# Patient Record
Sex: Male | Born: 1985 | Hispanic: Yes | Smoking: Former smoker
Health system: Southern US, Community
[De-identification: ages and names within clinical notes are randomized; demographics above are authoritative.]

## PROBLEM LIST (undated history)

## (undated) DIAGNOSIS — F4310 Post-traumatic stress disorder, unspecified: Secondary | ICD-10-CM

## (undated) DIAGNOSIS — I10 Essential (primary) hypertension: Secondary | ICD-10-CM

## (undated) DIAGNOSIS — G4730 Sleep apnea, unspecified: Secondary | ICD-10-CM

## (undated) HISTORY — DX: Essential (primary) hypertension: I10

## (undated) HISTORY — DX: Sleep apnea, unspecified: G47.30

---

## 2018-05-02 ENCOUNTER — Emergency Department (HOSPITAL_COMMUNITY)
Admission: EM | Admit: 2018-05-02 | Discharge: 2018-05-02 | Disposition: A | Discharge status: Home / Self Care | Payer: Non-veteran care | Attending: Emergency Medicine | Admitting: Emergency Medicine

## 2018-05-02 ENCOUNTER — Encounter (HOSPITAL_COMMUNITY): Payer: Self-pay | Admitting: *Deleted

## 2018-05-02 ENCOUNTER — Other Ambulatory Visit: Payer: Self-pay

## 2018-05-02 DIAGNOSIS — F410 Panic disorder [episodic paroxysmal anxiety] without agoraphobia: Secondary | ICD-10-CM | POA: Diagnosis not present

## 2018-05-02 DIAGNOSIS — R55 Syncope and collapse: Secondary | ICD-10-CM | POA: Diagnosis present

## 2018-05-02 HISTORY — DX: Post-traumatic stress disorder, unspecified: F43.10

## 2018-05-02 LAB — CBG MONITORING, ED: GLUCOSE-CAPILLARY: 92 mg/dL (ref 70–99)

## 2018-05-02 NOTE — ED Provider Notes (Signed)
[facility]-EMERGENCY DEPT Provider Note  CSN: [id number] Arrival date & time: 05/02/18 0054  Chief Complaint(s) Near Syncope  HPI Kenneth Horton is a 32 y.o. male with a history of PTSD who presents to the emergency department with near syncope.  Patient reports that he is been suffering from left shoulder pain and has been taken Flexeril which was previously prescribed to him last year.  He reports that the near syncopal episode occurred just prior to arrival.  States that he was trying to fall asleep when his mind started racing.  He began having a panic attack and hyperventilating.  He reports getting up to go to the bathroom when he started feeling lightheaded with upper and lower extremity paresthesias.  States that his legs became weak and he lowered himself to the ground.  Denied any falls or trauma.  No loss of consciousness.  He denied any associated chest pain or shortness of breath.  No nausea or vomiting.  No abdominal pain.  He denies any recent fevers or infections.  He did endorse smoking marijuana just prior to the episode.  Denied any alcohol consumption.  Denied any other illicit drug use.  HPI  Past Medical History Past Medical History:  Diagnosis Date  . PTSD (post-traumatic stress disorder)    There are no active problems to display for this patient.  Home Medication(s) Prior to Admission medications   Medication Sig Start Date End Date Taking? Authorizing Provider  cyclobenzaprine (FLEXERIL) 10 MG tablet Take 10 mg by mouth 3 (three) times daily as needed for muscle spasms.   Yes [provider]                                                                                                                                    Past Surgical History History reviewed. No pertinent surgical history. Family History No family history on file.  Social History Social History   Tobacco Use  . Smoking status: Not on file  Substance  Use Topics  . Alcohol use: Not on file  . Drug use: Not on file   Allergies Patient has no known allergies.  Review of Systems Review of Systems All other systems are reviewed and are negative for acute change except as noted in the HPI  Physical Exam Vital Signs  I have reviewed the triage vital signs BP (!) 145/82 (BP Location: Right Arm)   Pulse 79   Temp 98.3 F (36.8 C) (Oral)   Resp 16   Ht 5\' 9"  (1.753 m)   Wt 99.8 kg   SpO2 99%   BMI 32.49 kg/m   Physical Exam  Constitutional: He is oriented to person, place, and time. He appears well-developed and well-nourished. No distress.  HENT:  Head: Normocephalic and atraumatic.  Nose: Nose normal.  Eyes: Pupils are equal, round, and reactive to light. Conjunctivae and EOM are normal. Right eye exhibits no  discharge. Left eye exhibits no discharge. No scleral icterus.  Neck: Normal range of motion. Neck supple.  Cardiovascular: Normal rate and regular rhythm. Exam reveals no gallop and no friction rub.  No murmur heard. Pulmonary/Chest: Effort normal and breath sounds normal. No stridor. No respiratory distress. He has no rales.  Abdominal: Soft. He exhibits no distension. There is no tenderness.  Musculoskeletal: He exhibits no edema or tenderness.  Neurological: He is alert and oriented to person, place, and time.  Skin: Skin is warm and dry. No rash noted. He is not diaphoretic. No erythema.  Psychiatric: He has a normal mood and affect.  Vitals reviewed.   ED Results and Treatments Labs (all labs ordered are listed, but only abnormal results are displayed) Labs Reviewed  CBG MONITORING, ED  CBG MONITORING, ED                                                                                                                         EKG  EKG Interpretation  Date/Time:  Tuesday May 02 2018 01:06:36 EDT Ventricular Rate:  77 PR Interval:    QRS Duration: 114 QT Interval:  375 QTC Calculation: 425 R  Axis:   79 Text Interpretation:  Sinus rhythm Incomplete right bundle branch block ST elev, probable normal early repol pattern Baseline wander in lead(s) II III aVR aVF NO STEMI No old tracing to compare Confirmed by Drema Pry [phone]) on 05/02/2018 1:50:26 AM      Radiology No results found. Pertinent labs & imaging results that were available during my care of the patient were reviewed by me and considered in my medical decision making (see chart for details).  Medications Ordered in ED Medications - No data to display                                                                                                                                  Procedures Procedures  (including critical care time)  Medical Decision Making / ED Course I have reviewed the nursing notes for this encounter and the patient's prior records (if available in EHR or on provided paperwork).    Near syncopal episode in the setting of a panic attack with hyperventilation as well as medication with 10 mg of Flexeril and marijuana use.  Patient denied any symptoms concerning for cardiac etiology.  EKG with normal sinus rhythm and incomplete right bundle branch block,  otherwise no evidence of HOCM, Brugada, epsilon waves, no ischemia or dysrhythmias.  CBG wnl.  Orthostatics reassuring.  Patient able to tolerate oral hydration.  Ambulated without complication.  The patient appears reasonably screened and/or stabilized for discharge and I doubt any other medical condition or other [facility] requiring further screening, evaluation, or treatment in the ED at this time prior to discharge.  The patient is safe for discharge with strict return precautions.   Final Clinical Impression(s) / ED Diagnoses Final diagnoses:  Near syncope  Panic attack    Disposition: Discharge  Condition: Good  I have discussed the results, Dx and Tx plan with the patient who expressed understanding and agree(s) with the plan.  Discharge instructions discussed at great length. The patient was given strict return precautions who verbalized understanding of the instructions. No further questions at time of discharge.    ED Discharge Orders    None       [facility]  Schedule an appointment as soon as possible for a visit  As needed     This chart was dictated using voice recognition software.  Despite best efforts to proofread,  errors can occur which can change the documentation meaning.   Nira Conn, MD 05/02/18 [phone]

## 2018-05-02 NOTE — ED Triage Notes (Signed)
Pt states around midnight he had gotten up to go to the bathroom and he felt like he was having a panic attack, said his legs gave out on him and he passed out.

## 2019-05-12 ENCOUNTER — Emergency Department (HOSPITAL_COMMUNITY)
Admission: EM | Admit: 2019-05-12 | Discharge: 2019-05-12 | Disposition: A | Discharge status: Home / Self Care | Payer: No Typology Code available for payment source | Attending: Emergency Medicine | Admitting: Emergency Medicine

## 2019-05-12 ENCOUNTER — Other Ambulatory Visit: Payer: Self-pay

## 2019-05-12 ENCOUNTER — Encounter (HOSPITAL_COMMUNITY): Payer: Self-pay

## 2019-05-12 DIAGNOSIS — Y93E6 Activity, residential relocation: Secondary | ICD-10-CM | POA: Diagnosis not present

## 2019-05-12 DIAGNOSIS — W228XXA Striking against or struck by other objects, initial encounter: Secondary | ICD-10-CM | POA: Diagnosis not present

## 2019-05-12 DIAGNOSIS — Y999 Unspecified external cause status: Secondary | ICD-10-CM | POA: Diagnosis not present

## 2019-05-12 DIAGNOSIS — Z87891 Personal history of nicotine dependence: Secondary | ICD-10-CM | POA: Diagnosis not present

## 2019-05-12 DIAGNOSIS — S51812A Laceration without foreign body of left forearm, initial encounter: Secondary | ICD-10-CM | POA: Insufficient documentation

## 2019-05-12 DIAGNOSIS — Y92009 Unspecified place in unspecified non-institutional (private) residence as the place of occurrence of the external cause: Secondary | ICD-10-CM | POA: Diagnosis not present

## 2019-05-12 MED ORDER — LIDOCAINE HCL (PF) 1 % IJ SOLN
5.0000 mL | Freq: Once | INTRAMUSCULAR | Status: DC
  Filled 2019-05-12: qty 30

## 2019-05-12 NOTE — Discharge Instructions (Addendum)
WOUND CARE °Please have your stitches/staples removed in 7-10 days or sooner if you have concerns. You may do this at any available urgent care or at your primary care doctor's office. ° Keep area clean and dry for 24 hours. Do not remove °bandage, if applied. ° After 24 hours, remove bandage and wash wound °gently with mild soap and warm water. Reapply °a new bandage after cleaning wound, if directed. ° Continue daily cleansing with soap and water until °stitches/staples are removed. ° Do not apply any ointments or creams to the wound °while stitches/staples are in place, as this may cause °delayed healing. ° Seek medical careif you experience any of the following °signs of infection: Swelling, redness, pus drainage, °streaking, fever >101.0 F ° Seek care if you experience excessive bleeding °that does not stop after 15-20 minutes of constant, firm °pressure. °  °

## 2019-05-12 NOTE — ED Provider Notes (Signed)
[facility]   CSN: [id number] Arrival date & time: 05/12/19  1914     History   Chief Complaint No chief complaint on file.   HPI Kenneth Horton is a 33 y.o. male with no significant past medical history presents emergency department chief complaint of left forearm laceration.  Patient states that he was helping his mother move furniture when his left arm was caught by a metal piece on the furniture.  Bleeding is controlled at this time.  He is up-to-date on his tetanus vaccination which was in 2014.  He denies any numbness, tingling, weakness in the hand or wrist.  He has no other complaints. Laceration occurred about 2 hours ago.    HPI  Past Medical History:  Diagnosis Date  . PTSD (post-traumatic stress disorder)     There are no active problems to display for this patient.   No past surgical history on file.      Home Medications    Prior to Admission medications   Medication Sig Start Date End Date Taking? Authorizing Provider  cyclobenzaprine (FLEXERIL) 10 MG tablet Take 10 mg by mouth 3 (three) times daily as needed for muscle spasms.    [provider]    Family History No family history on file.  Social History Social History   Tobacco Use  . Smoking status: Former Games developer  . Smokeless tobacco: Former Engineer, water Use Topics  . Alcohol Use    Frequency: Never    Comment: socially  . Drug use: Never     Allergies   Patient has no known allergies.   Review of Systems Review of Systems  Skin: Positive for wound.  Neurological: Negative for weakness and numbness.     Physical Exam Updated Vital Signs BP (!) 146/84 (BP Location: Right Arm)   Pulse 99   Temp 98.5 F (36.9 C) (Oral)   Resp 18   Ht 5\' 9"  (1.753 m)   Wt 93 kg   SpO2 98%   BMI 30.27 kg/m   Physical Exam Vitals signs and nursing note reviewed.  Constitutional:      General: He is not in acute distress.     Appearance: He is well-developed. He is not diaphoretic.  HENT:     Head: Normocephalic and atraumatic.  Eyes:     General: No scleral icterus.    Conjunctiva/sclera: Conjunctivae normal.  Neck:     Musculoskeletal: Normal range of motion and neck supple.  Cardiovascular:     Rate and Rhythm: Normal rate and regular rhythm.     Heart sounds: Normal heart sounds. No friction rub.  Pulmonary:     Effort: Pulmonary effort is normal. No respiratory distress.     Breath sounds: Normal breath sounds.  Abdominal:     Palpations: Abdomen is soft.     Tenderness: There is no abdominal tenderness.  Musculoskeletal:     Comments: LUE exam Inspection- No erythema, swelling, atrophy, hypertrophy, abrasions, or lacerations noted. Palpation- No TTP, compartments soft ROM- Full ROM about the shoulder, elbow, wrist. Strength- 5/5 AIN/PIN/U NV- SILT M/R/U, +2 Radial +2 ulnar pulse Superficial laceration left forearm  Skin:    General: Skin is warm and dry.  Neurological:     Mental Status: He is alert.  Psychiatric:        Behavior: Behavior normal.      ED Treatments / Results  Labs (all labs ordered are listed, but only abnormal results  are displayed) Labs Reviewed - No data to display  EKG None  Radiology No results found.  Procedures .Marland KitchenLaceration Repair  Date/Time: 05/12/2019 10:28 PM Performed by: Margarita Mail, PA-C Authorized by: Margarita Mail, PA-C   Consent:    Consent obtained:  Verbal   Consent given by:  Patient   Risks discussed:  Infection, need for additional repair, pain, poor cosmetic result and poor wound healing   Alternatives discussed:  No treatment and delayed treatment Universal protocol:    Procedure explained and questions answered to patient or proxy's satisfaction: yes     Relevant documents present and verified: yes     Test results available and properly labeled: yes     Imaging studies available: yes     Required blood products,  implants, devices, and special equipment available: yes     Site/side marked: yes     Immediately prior to procedure, a time out was called: yes     Patient identity confirmed:  Verbally with patient Anesthesia (see MAR for exact dosages):    Anesthesia method:  Local infiltration   Local anesthetic:  Lidocaine 1% w/o epi Laceration details:    Location:  Shoulder/arm   Shoulder/arm location:  L upper arm   Length (cm):  2 Repair type:    Repair type:  Simple Pre-procedure details:    Preparation:  Patient was prepped and draped in usual sterile fashion Exploration:    Wound exploration: wound explored through full range of motion   Treatment:    Area cleansed with:  Betadine   Amount of cleaning:  Standard   Irrigation solution:  Sterile water Skin repair:    Repair method:  Sutures   Suture size:  5-0   Suture material:  Prolene   Suture technique:  Running locked   Number of sutures:  3 Approximation:    Approximation:  Close Post-procedure details:    Dressing:  Non-adherent dressing   Patient tolerance of procedure:  Tolerated well, no immediate complications   (including critical care time)  Medications Ordered in ED Medications  lidocaine (PF) (XYLOCAINE) 1 % injection 5 mL (has no administration in time range)     Initial Impression / Assessment and Plan / ED Course  I have reviewed the triage vital signs and the nursing notes.  Pertinent labs & imaging results that were available during my care of the patient were reviewed by me and considered in my medical decision making (see chart for details).    Kenneth Horton is a 33 y.o. male who presents to ED for laceration of the left forearm. Wound thoroughly cleaned in ED today. Wound explored and bottom of wound seen in a bloodless field. Laceration repaired as dictated above. Patient counseled on home wound care. Follow up with PCP/urgent care or return to ER for suture removal in 7-10 days. Patient was  urged to return to the Emergency Department for worsening pain, swelling, expanding erythema especially if it streaks away from the affected area, fever, or for any additional concerns. Patient verbalized understanding. All questions answered.       Final Clinical Impressions(s) / ED Diagnoses   Final diagnoses:  None    ED Discharge Orders    None       Margarita Mail, PA-C 05/12/19 2229    Julianne Rice, MD 05/12/19 2317

## 2019-05-12 NOTE — ED Triage Notes (Signed)
Pt was helping mom move furniture and metal piece cut left forearm 1 inch lac. Pt reports no numbness or pain at the site.

## 2020-05-30 ENCOUNTER — Emergency Department (HOSPITAL_COMMUNITY)
Admission: EM | Admit: 2020-05-30 | Discharge: 2020-05-31 | Disposition: A | Discharge status: Home / Self Care | Payer: No Typology Code available for payment source | Attending: Emergency Medicine | Admitting: Emergency Medicine

## 2020-05-30 ENCOUNTER — Other Ambulatory Visit: Payer: Self-pay

## 2020-05-30 ENCOUNTER — Encounter (HOSPITAL_COMMUNITY): Payer: Self-pay | Admitting: Emergency Medicine

## 2020-05-30 DIAGNOSIS — S0992XA Unspecified injury of nose, initial encounter: Secondary | ICD-10-CM | POA: Diagnosis present

## 2020-05-30 DIAGNOSIS — Z87891 Personal history of nicotine dependence: Secondary | ICD-10-CM | POA: Diagnosis not present

## 2020-05-30 DIAGNOSIS — Y92481 Parking lot as the place of occurrence of the external cause: Secondary | ICD-10-CM | POA: Insufficient documentation

## 2020-05-30 DIAGNOSIS — R55 Syncope and collapse: Secondary | ICD-10-CM | POA: Insufficient documentation

## 2020-05-30 DIAGNOSIS — W01198A Fall on same level from slipping, tripping and stumbling with subsequent striking against other object, initial encounter: Secondary | ICD-10-CM | POA: Insufficient documentation

## 2020-05-30 DIAGNOSIS — S022XXA Fracture of nasal bones, initial encounter for closed fracture: Secondary | ICD-10-CM | POA: Diagnosis not present

## 2020-05-30 DIAGNOSIS — Y9301 Activity, walking, marching and hiking: Secondary | ICD-10-CM | POA: Diagnosis not present

## 2020-05-30 LAB — CBC WITH DIFFERENTIAL/PLATELET
Abs Immature Granulocytes: 0.06 10*3/uL (ref 0.00–0.07)
Basophils Absolute: 0 10*3/uL (ref 0.0–0.1)
Basophils Relative: 1 %
Eosinophils Absolute: 0.1 10*3/uL (ref 0.0–0.5)
Eosinophils Relative: 1 %
HCT: 43.8 % (ref 39.0–52.0)
Hemoglobin: 15 g/dL (ref 13.0–17.0)
Immature Granulocytes: 1 %
Lymphocytes Relative: 20 %
Lymphs Abs: 1.7 10*3/uL (ref 0.7–4.0)
MCH: 29.9 pg (ref 26.0–34.0)
MCHC: 34.2 g/dL (ref 30.0–36.0)
MCV: 87.3 fL (ref 80.0–100.0)
Monocytes Absolute: 0.7 10*3/uL (ref 0.1–1.0)
Monocytes Relative: 9 %
Neutro Abs: 5.8 10*3/uL (ref 1.7–7.7)
Neutrophils Relative %: 68 %
Platelets: 200 10*3/uL (ref 150–400)
RBC: 5.02 MIL/uL (ref 4.22–5.81)
RDW: 13.2 % (ref 11.5–15.5)
WBC: 8.5 10*3/uL (ref 4.0–10.5)
nRBC: 0 % (ref 0.0–0.2)

## 2020-05-30 LAB — COMPREHENSIVE METABOLIC PANEL
ALT: 80 U/L — ABNORMAL HIGH (ref 0–44)
AST: 35 U/L (ref 15–41)
Albumin: 4.1 g/dL (ref 3.5–5.0)
Alkaline Phosphatase: 66 U/L (ref 38–126)
Anion gap: 9 (ref 5–15)
BUN: 17 mg/dL (ref 6–20)
CO2: 26 mmol/L (ref 22–32)
Calcium: 9.2 mg/dL (ref 8.9–10.3)
Chloride: 100 mmol/L (ref 98–111)
Creatinine, Ser: 0.99 mg/dL (ref 0.61–1.24)
GFR, Estimated: >60 mL/min (ref >60)
Glucose, Bld: 129 mg/dL — ABNORMAL HIGH (ref 70–99)
Potassium: 3.8 mmol/L (ref 3.5–5.1)
Sodium: 135 mmol/L (ref 135–145)
Total Bilirubin: 0.3 mg/dL (ref 0.3–1.2)
Total Protein: 6.9 g/dL (ref 6.5–8.1)

## 2020-05-30 LAB — URINALYSIS, ROUTINE W REFLEX MICROSCOPIC
Bilirubin Urine: NEGATIVE
Glucose, UA: NEGATIVE mg/dL
Hgb urine dipstick: NEGATIVE
Ketones, ur: NEGATIVE mg/dL
Leukocytes,Ua: NEGATIVE
Nitrite: NEGATIVE
Protein, ur: NEGATIVE mg/dL
Specific Gravity, Urine: 1.008 (ref 1.005–1.030)
pH: 5 (ref 5.0–8.0)

## 2020-05-30 NOTE — ED Triage Notes (Signed)
Pt st's he had a syncopal episode earlier tonight.  St's when he fell he hit his face on cement.  Pt c/o nose pain and headache.  No hx of passing out in past

## 2020-05-31 ENCOUNTER — Other Ambulatory Visit: Payer: Self-pay

## 2020-05-31 ENCOUNTER — Emergency Department (HOSPITAL_COMMUNITY): Payer: No Typology Code available for payment source

## 2020-05-31 MED ORDER — OXYCODONE-ACETAMINOPHEN 5-325 MG PO TABS
1.0000 | ORAL_TABLET | Freq: Once | ORAL | Status: AC
  Administered 2020-05-31: 1 via ORAL
  Filled 2020-05-31: qty 1

## 2020-05-31 NOTE — ED Provider Notes (Signed)
[facility] EMERGENCY DEPARTMENT Provider Note   CSN: [id number] Arrival date & time: 05/30/20  2141     History Chief Complaint  Patient presents with  . Syncopal Episode    Kenneth Horton is a 34 y.o. male.  Patient presents to the emergency department for evaluation after syncopal episode.  Patient was eating dinner when he started to feel bad.  He reports any suddenly felt very dizzy and like he was going to pass out.  He left the restaurant with his girlfriend to go outside and get some fresh air.  She reports that as they are walking across the parking lot he fell to his knees and then hit his face on the ground.  Patient complaining of nose pain at this time.  He has never had any previous syncopal episodes.  He is not experiencing any chest pain, shortness of breath.        Past Medical History:  Diagnosis Date  . PTSD (post-traumatic stress disorder)   . PTSD (post-traumatic stress disorder)     There are no problems to display for this patient.   History reviewed. No pertinent surgical history.     No family history on file.  Social History   Tobacco Use  . Smoking status: Former Games developer  . Smokeless tobacco: Former Clinical biochemist  . Vaping Use: Never used  Substance Use Topics  . Alcohol use: Never    Comment: socially  . Drug use: Never    Home Medications Prior to Admission medications   Not on File    Allergies    Patient has no known allergies.  Review of Systems   Review of Systems  HENT: Positive for facial swelling.   Neurological: Positive for syncope.  All other systems reviewed and are negative.   Physical Exam Updated Vital Signs BP 107/71 (BP Location: Right Arm)   Pulse 70   Temp 98 F (36.7 C) (Oral)   Resp 17   Ht 5\' 9"  (1.753 m)   Wt 117.9 kg   SpO2 96%   BMI 38.40 kg/m   Physical Exam Vitals and nursing note reviewed.  Constitutional:      General: He is not in acute distress.     Appearance: Normal appearance. He is well-developed.  HENT:     Head: Normocephalic. Abrasion (Nose) and contusion (Nose) present.     Jaw: There is normal jaw occlusion.     Right Ear: Hearing normal.     Left Ear: Hearing normal.     Nose: Nose normal.  Eyes:     Conjunctiva/sclera: Conjunctivae normal.     Pupils: Pupils are equal, round, and reactive to light.  Cardiovascular:     Rate and Rhythm: Regular rhythm.     Heart sounds: S1 normal and S2 normal. No murmur heard.  No friction rub. No gallop.   Pulmonary:     Effort: Pulmonary effort is normal. No respiratory distress.     Breath sounds: Normal breath sounds.  Chest:     Chest wall: No tenderness.  Abdominal:     General: Bowel sounds are normal.     Palpations: Abdomen is soft.     Tenderness: There is no abdominal tenderness. There is no guarding or rebound. Negative signs include Murphy's sign and McBurney's sign.     Hernia: No hernia is present.  Musculoskeletal:        General: Normal range of motion.     Cervical  back: Normal range of motion and neck supple.  Skin:    General: Skin is warm and dry.     Findings: No rash.  Neurological:     Mental Status: He is alert and oriented to person, place, and time.     GCS: GCS eye subscore is 4. GCS verbal subscore is 5. GCS motor subscore is 6.     Cranial Nerves: No cranial nerve deficit.     Sensory: No sensory deficit.     Coordination: Coordination normal.  Psychiatric:        Speech: Speech normal.        Behavior: Behavior normal.        Thought Content: Thought content normal.     ED Results / Procedures / Treatments   Labs (all labs ordered are listed, but only abnormal results are displayed) Labs Reviewed  COMPREHENSIVE METABOLIC PANEL - Abnormal; Notable for the following components:      Result Value   Glucose, Bld 129 (*)    ALT 80 (*)    All other components within normal limits  URINALYSIS, ROUTINE W REFLEX MICROSCOPIC - Abnormal; Notable  for the following components:   Color, Urine STRAW (*)    All other components within normal limits  CBC WITH DIFFERENTIAL/PLATELET    EKG EKG Interpretation  Date/Time:  Friday May 30 2020 22:07:28 EDT Ventricular Rate:  114 PR Interval:  144 QRS Duration: 104 QT Interval:  338 QTC Calculation: 465 R Axis:   95 Text Interpretation: Sinus tachycardia Possible Left atrial enlargement Rightward axis Incomplete right bundle branch block Borderline ECG No significant change since last tracing Confirmed by Gilda Crease [phone]) on 05/31/2020 5:58:30 AM   Radiology CT HEAD WO CONTRAST  Result Date: 05/31/2020 CLINICAL DATA:  Head trauma, moderate/severe. Syncopal episode, fall in face down, abrasion to nose. EXAM: CT HEAD WITHOUT CONTRAST CT MAXILLOFACIAL WITHOUT CONTRAST TECHNIQUE: Multidetector CT imaging of the head and maxillofacial structures were performed using the standard protocol without intravenous contrast. Multiplanar CT image reconstructions of the maxillofacial structures were also generated. COMPARISON:  No pertinent prior exams are available for comparison. FINDINGS: CT HEAD FINDINGS Brain: Cerebral volume is normal. There is no acute intracranial hemorrhage. No demarcated cortical infarct. No extra-axial fluid collection. No evidence of intracranial mass. No midline shift. Vascular: No hyperdense vessel. Skull: Normal. Negative for fracture or focal lesion. CT MAXILLOFACIAL FINDINGS Osseous: Minimally displaced fracture of the left nasal bone (series 3, image 67) (series 5, image 5). No other maxillofacial fracture is identified. Orbits: No acute finding. The globes are normal in size and contour. The extraocular muscles and optic nerve sheath complexes are symmetric and unremarkable. Sinuses: Mild ethmoid and maxillary sinus mucosal thickening. Soft tissues: Soft tissue swelling along the nose. IMPRESSION: CT head: No evidence of acute intracranial abnormality. CT  maxillofacial: 1. Minimally displaced fracture of the left nasal bone. 2. Soft tissue swelling along the nose. 3. Mild ethmoid and maxillary sinus mucosal thickening. Electronically Signed   By: Jackey Loge DO   On: 05/31/2020 07:09   CT MAXILLOFACIAL WO CONTRAST  Result Date: 05/31/2020 CLINICAL DATA:  Head trauma, moderate/severe. Syncopal episode, fall in face down, abrasion to nose. EXAM: CT HEAD WITHOUT CONTRAST CT MAXILLOFACIAL WITHOUT CONTRAST TECHNIQUE: Multidetector CT imaging of the head and maxillofacial structures were performed using the standard protocol without intravenous contrast. Multiplanar CT image reconstructions of the maxillofacial structures were also generated. COMPARISON:  No pertinent prior exams are available for comparison.  FINDINGS: CT HEAD FINDINGS Brain: Cerebral volume is normal. There is no acute intracranial hemorrhage. No demarcated cortical infarct. No extra-axial fluid collection. No evidence of intracranial mass. No midline shift. Vascular: No hyperdense vessel. Skull: Normal. Negative for fracture or focal lesion. CT MAXILLOFACIAL FINDINGS Osseous: Minimally displaced fracture of the left nasal bone (series 3, image 67) (series 5, image 5). No other maxillofacial fracture is identified. Orbits: No acute finding. The globes are normal in size and contour. The extraocular muscles and optic nerve sheath complexes are symmetric and unremarkable. Sinuses: Mild ethmoid and maxillary sinus mucosal thickening. Soft tissues: Soft tissue swelling along the nose. IMPRESSION: CT head: No evidence of acute intracranial abnormality. CT maxillofacial: 1. Minimally displaced fracture of the left nasal bone. 2. Soft tissue swelling along the nose. 3. Mild ethmoid and maxillary sinus mucosal thickening. Electronically Signed   By: Jackey Loge DO   On: 05/31/2020 07:09    Procedures Procedures (including critical care time)  Medications Ordered in ED Medications   oxyCODONE-acetaminophen (PERCOCET/ROXICET) 5-325 MG per tablet 1 tablet (1 tablet Oral Given 05/31/20 0537)    ED Course  I have reviewed the triage vital signs and the nursing notes.  Pertinent labs & imaging results that were available during my care of the patient were reviewed by me and considered in my medical decision making (see chart for details).    MDM Rules/Calculators/A&P                          Patient presents to the emergency department for evaluation after a syncopal episode.  Patient did have precursor some things of feeling unwell and dizziness.  He is neurologically at his baseline.  No history of any neurologic deficits, none currently.  No cardiac symptoms.  Patient's main complaint is nose pain.  CT head and maxillofacial bones does show minimally displaced left nasal bone fracture, no other injury.  Lab work was otherwise unremarkable.  Cause of syncope unclear at this time but there is low concern for cardiac syncope.  Final Clinical Impression(s) / ED Diagnoses Final diagnoses:  Closed fracture of nasal bone, initial encounter  Syncope, unspecified syncope type    Rx / DC Orders ED Discharge Orders    None       Gilda Crease, MD 05/31/20 [phone]

## 2020-05-31 NOTE — ED Notes (Signed)
Patient with abrasions to nose; no bleeding; says fell on his face when he passed out earlier.

## 2020-05-31 NOTE — ED Notes (Signed)
Assuming care of patient at this time. Pt is resting in bed and in NAD. Side rails up for safety. Bed in low position. Call bell within reach.

## 2020-08-12 ENCOUNTER — Other Ambulatory Visit: Payer: Self-pay

## 2020-08-12 ENCOUNTER — Emergency Department (HOSPITAL_COMMUNITY)
Admission: EM | Admit: 2020-08-12 | Discharge: 2020-08-13 | Disposition: A | Discharge status: Home / Self Care | Payer: No Typology Code available for payment source | Attending: Emergency Medicine | Admitting: Emergency Medicine

## 2020-08-12 ENCOUNTER — Encounter (HOSPITAL_COMMUNITY): Payer: Self-pay | Admitting: *Deleted

## 2020-08-12 DIAGNOSIS — R002 Palpitations: Secondary | ICD-10-CM | POA: Diagnosis not present

## 2020-08-12 DIAGNOSIS — F419 Anxiety disorder, unspecified: Secondary | ICD-10-CM | POA: Insufficient documentation

## 2020-08-12 DIAGNOSIS — R0789 Other chest pain: Secondary | ICD-10-CM | POA: Insufficient documentation

## 2020-08-12 DIAGNOSIS — Z87891 Personal history of nicotine dependence: Secondary | ICD-10-CM | POA: Insufficient documentation

## 2020-08-12 DIAGNOSIS — R000 Tachycardia, unspecified: Secondary | ICD-10-CM | POA: Insufficient documentation

## 2020-08-12 DIAGNOSIS — R200 Anesthesia of skin: Secondary | ICD-10-CM | POA: Diagnosis not present

## 2020-08-12 DIAGNOSIS — R079 Chest pain, unspecified: Secondary | ICD-10-CM

## 2020-08-12 LAB — BASIC METABOLIC PANEL
Anion gap: 13 (ref 5–15)
BUN: 14 mg/dL (ref 6–20)
CO2: 22 mmol/L (ref 22–32)
Calcium: 9.6 mg/dL (ref 8.9–10.3)
Chloride: 99 mmol/L (ref 98–111)
Creatinine, Ser: 0.92 mg/dL (ref 0.61–1.24)
GFR, Estimated: >60 mL/min (ref >60)
Glucose, Bld: 113 mg/dL — ABNORMAL HIGH (ref 70–99)
Potassium: 4.1 mmol/L (ref 3.5–5.1)
Sodium: 134 mmol/L — ABNORMAL LOW (ref 135–145)

## 2020-08-12 LAB — CBC
HCT: 47.3 % (ref 39.0–52.0)
Hemoglobin: 16.9 g/dL (ref 13.0–17.0)
MCH: 31.1 pg (ref 26.0–34.0)
MCHC: 35.7 g/dL (ref 30.0–36.0)
MCV: 87.1 fL (ref 80.0–100.0)
Platelets: 217 10*3/uL (ref 150–400)
RBC: 5.43 MIL/uL (ref 4.22–5.81)
RDW: 12.4 % (ref 11.5–15.5)
WBC: 9.8 10*3/uL (ref 4.0–10.5)
nRBC: 0 % (ref 0.0–0.2)

## 2020-08-12 NOTE — ED Triage Notes (Signed)
Pt says that he was riding home, he felt jittery, weak, short of breath and anxious on Thursday- has had similar episodes on and off. Having palpitations intermittently. Has been having issues with sleeping at night.

## 2020-08-13 LAB — TSH: TSH: 3.419 u[IU]/mL (ref 0.350–4.500)

## 2020-08-13 LAB — TROPONIN I (HIGH SENSITIVITY): Troponin I (High Sensitivity): 5 ng/L (ref <18)

## 2020-08-13 MED ORDER — HYDROXYZINE HCL 25 MG PO TABS: 25.0000 mg | ORAL_TABLET | Freq: Four times a day (QID) | ORAL | 0 refills | Status: DC | PRN

## 2020-08-13 NOTE — Discharge Instructions (Addendum)
Your heart test and thyroid test are normal. [facility]-up with [facility] and local cardiologist as needed. You can take hydroxyzine for severe anxiety if other nonpharmaceutical options do not work.

## 2020-08-13 NOTE — ED Provider Notes (Signed)
[facility] EMERGENCY DEPARTMENT Provider Note   CSN: [id number] Arrival date & time: 08/12/20  2025     History Chief Complaint  Patient presents with  . Palpitations    Kenneth Horton is a 35 y.o. male.  Patient with history of PTSD, anxiety, has been off medications for 1 year since he has been doing well presents with intermittent chest pressure, feeling jittery, intermittent numbness hands and feet for the past week.  Patient's had these in the past however gradually worsening.  Patient's had weight gain recently since having a child being up all night eating more approximately 20 pounds.  No history of heart problems.  No recent travel, leg swelling, recent surgery or blood clot history.  Currently minimal chest pressure, no shortness of breath.  No exertional symptoms.  No fevers or cough.        Past Medical History:  Diagnosis Date  . PTSD (post-traumatic stress disorder)   . PTSD (post-traumatic stress disorder)     There are no problems to display for this patient.   History reviewed. No pertinent surgical history.     No family history on file.  Social History   Tobacco Use  . Smoking status: Former Games developer  . Smokeless tobacco: Former Clinical biochemist  . Vaping Use: Never used  Substance Use Topics  . Alcohol use: Never    Comment: socially  . Drug use: Never    Home Medications Prior to Admission medications   Medication Sig Start Date End Date Taking? Authorizing Provider  hydrOXYzine (ATARAX/VISTARIL) 25 MG tablet Take 1 tablet (25 mg total) by mouth every 6 (six) hours as needed for anxiety. 08/13/20  Yes Blane Ohara, MD    Allergies    Patient has no known allergies.  Review of Systems   Review of Systems  Constitutional: Negative for chills and fever.  HENT: Negative for congestion.   Eyes: Negative for visual disturbance.  Respiratory: Negative for cough and shortness of breath.   Cardiovascular: Positive  for chest pain.  Gastrointestinal: Negative for abdominal pain and vomiting.  Genitourinary: Negative for dysuria and flank pain.  Musculoskeletal: Negative for back pain, neck pain and neck stiffness.  Skin: Negative for rash.  Neurological: Positive for light-headedness. Negative for headaches.  Psychiatric/Behavioral: The patient is nervous/anxious.     Physical Exam Updated Vital Signs BP (!) 150/90   Pulse 85   Temp 98 F (36.7 C) (Oral)   Resp 20   SpO2 99%   Physical Exam Vitals and nursing note reviewed.  Constitutional:      Appearance: He is well-developed and well-nourished.  HENT:     Head: Normocephalic and atraumatic.  Eyes:     General:        Right eye: No discharge.        Left eye: No discharge.     Conjunctiva/sclera: Conjunctivae normal.  Neck:     Trachea: No tracheal deviation.  Cardiovascular:     Rate and Rhythm: Normal rate and regular rhythm.  Pulmonary:     Effort: Pulmonary effort is normal.     Breath sounds: Normal breath sounds.  Abdominal:     General: There is no distension.     Palpations: Abdomen is soft.     Tenderness: There is no abdominal tenderness. There is no guarding.  Musculoskeletal:        General: No swelling, tenderness or edema. Normal range of motion.     Cervical  back: Normal range of motion and neck supple.  Skin:    General: Skin is warm.     Findings: No rash.  Neurological:     General: No focal deficit present.     Mental Status: He is alert and oriented to person, place, and time.  Psychiatric:        Mood and Affect: Mood is anxious.     ED Results / Procedures / Treatments   Labs (all labs ordered are listed, but only abnormal results are displayed) Labs Reviewed  BASIC METABOLIC PANEL - Abnormal; Notable for the following components:      Result Value   Sodium 134 (*)    Glucose, Bld 113 (*)    All other components within normal limits  CBC  TSH  TROPONIN I (HIGH SENSITIVITY)    EKG EKG  Interpretation  Date/Time:  Tuesday August 12 2020 21:46:35 EST Ventricular Rate:  103 PR Interval:  154 QRS Duration: 104 QT Interval:  340 QTC Calculation: 445 R Axis:   111 Text Interpretation: Sinus tachycardia Poor baseline Reconfirmed by Blane Ohara [phone]) on 08/13/2020 8:36:42 AM   Radiology No results found.  Procedures Procedures (including critical care time)  Medications Ordered in ED Medications - No data to display  ED Course  I have reviewed the triage vital signs and the nursing notes.  Pertinent labs & imaging results that were available during my care of the patient were reviewed by me and considered in my medical decision making (see chart for details).    MDM Rules/Calculators/A&P                          Patient presents with clinical concern for anxiety as cause for symptoms, other differentials include atypical ACS, thyroid related metabolic, arrhythmia, other.  Initial blood work reviewed overall unremarkable sodium 134, other electrolytes normal, normal kidney function, normal hemoglobin and normal white blood cell count.  With intermittent chest pressure plan for single troponin and thyroid testing with outpatient follow-up at the [facility] and local cardiologist as needed.  Troponin and thyroid testing within normal limits.  Patient stable for continued outpatient follow-up.  Hydroxyzine as needed for a few days.   Final Clinical Impression(s) / ED Diagnoses Final diagnoses:  Anxiety  Palpitations  Acute chest pain    Rx / DC Orders ED Discharge Orders         Ordered    hydrOXYzine (ATARAX/VISTARIL) 25 MG tablet  Every 6 hours PRN        08/13/20 1034           Blane Ohara, MD 08/13/20 1034

## 2020-09-03 ENCOUNTER — Encounter (HOSPITAL_COMMUNITY): Payer: Self-pay

## 2020-09-03 ENCOUNTER — Emergency Department (HOSPITAL_COMMUNITY)
Admission: EM | Admit: 2020-09-03 | Discharge: 2020-09-03 | Disposition: A | Discharge status: Home / Self Care | Payer: No Typology Code available for payment source | Attending: Emergency Medicine | Admitting: Emergency Medicine

## 2020-09-03 DIAGNOSIS — R55 Syncope and collapse: Secondary | ICD-10-CM | POA: Diagnosis not present

## 2020-09-03 DIAGNOSIS — Z79899 Other long term (current) drug therapy: Secondary | ICD-10-CM | POA: Diagnosis not present

## 2020-09-03 DIAGNOSIS — R42 Dizziness and giddiness: Secondary | ICD-10-CM | POA: Diagnosis not present

## 2020-09-03 DIAGNOSIS — R002 Palpitations: Secondary | ICD-10-CM | POA: Diagnosis not present

## 2020-09-03 DIAGNOSIS — Z87891 Personal history of nicotine dependence: Secondary | ICD-10-CM | POA: Diagnosis not present

## 2020-09-03 LAB — CBC
HCT: 47.7 % (ref 39.0–52.0)
Hemoglobin: 16.1 g/dL (ref 13.0–17.0)
MCH: 29.6 pg (ref 26.0–34.0)
MCHC: 33.8 g/dL (ref 30.0–36.0)
MCV: 87.7 fL (ref 80.0–100.0)
Platelets: 176 10*3/uL (ref 150–400)
RBC: 5.44 MIL/uL (ref 4.22–5.81)
RDW: 12.2 % (ref 11.5–15.5)
WBC: 7.2 10*3/uL (ref 4.0–10.5)
nRBC: 0 % (ref 0.0–0.2)

## 2020-09-03 LAB — URINALYSIS, ROUTINE W REFLEX MICROSCOPIC
Bilirubin Urine: NEGATIVE
Glucose, UA: NEGATIVE mg/dL
Hgb urine dipstick: NEGATIVE
Ketones, ur: NEGATIVE mg/dL
Nitrite: NEGATIVE
Protein, ur: NEGATIVE mg/dL
Specific Gravity, Urine: 1.012 (ref 1.005–1.030)
pH: 5 (ref 5.0–8.0)

## 2020-09-03 LAB — BASIC METABOLIC PANEL
Anion gap: 13 (ref 5–15)
BUN: 11 mg/dL (ref 6–20)
CO2: 24 mmol/L (ref 22–32)
Calcium: 9.4 mg/dL (ref 8.9–10.3)
Chloride: 102 mmol/L (ref 98–111)
Creatinine, Ser: 1.01 mg/dL (ref 0.61–1.24)
GFR, Estimated: >60 mL/min (ref >60)
Glucose, Bld: 114 mg/dL — ABNORMAL HIGH (ref 70–99)
Potassium: 3.8 mmol/L (ref 3.5–5.1)
Sodium: 139 mmol/L (ref 135–145)

## 2020-09-03 NOTE — ED Provider Notes (Signed)
[facility] EMERGENCY DEPARTMENT Provider Note   CSN: [id number] Arrival date & time: 09/03/20  0450     History Chief Complaint  Patient presents with  . Loss of Consciousness    Kenneth Horton is a 35 y.o. male.  Patient presents after syncopal episode and palpitations.  Patient's had recurrent episodes over the past few months.  Worse at night feel lightheaded and heart racing and then sometimes pass out.  Nonexertional.  No cardiac or blood clot history.  Currently no symptoms.  [facility] and [facility] blood test done that were unremarkable except liver function was elevated.  Patient denies alcohol or illegal drugs.  No thyroid disorder history.  Patient feels he is stable from mental health standpoint with PTSD history.        Past Medical History:  Diagnosis Date  . PTSD (post-traumatic stress disorder)   . PTSD (post-traumatic stress disorder)     There are no problems to display for this patient.   History reviewed. No pertinent surgical history.     No family history on file.  Social History   Tobacco Use  . Smoking status: Former Games developer  . Smokeless tobacco: Former Clinical biochemist  . Vaping Use: Never used  Substance Use Topics  . Alcohol use: Never    Comment: socially  . Drug use: Never    Home Medications Prior to Admission medications   Medication Sig Start Date End Date Taking? Authorizing Provider  hydrOXYzine (ATARAX/VISTARIL) 25 MG tablet Take 1 tablet (25 mg total) by mouth every 6 (six) hours as needed for anxiety. 08/13/20   Blane Ohara, MD    Allergies    Patient has no known allergies.  Review of Systems   Review of Systems  Constitutional: Negative for chills and fever.  HENT: Negative for congestion.   Eyes: Negative for visual disturbance.  Respiratory: Negative for shortness of breath.   Cardiovascular: Negative for chest pain.  Gastrointestinal: Negative for abdominal pain  and vomiting.  Genitourinary: Negative for dysuria and flank pain.  Musculoskeletal: Negative for back pain, neck pain and neck stiffness.  Skin: Negative for rash.  Neurological: Positive for syncope and light-headedness. Negative for headaches.    Physical Exam Updated Vital Signs BP 140/83 (BP Location: Left Arm)   Pulse 74   Temp 98.3 F (36.8 C) (Oral)   Resp 16   SpO2 99%   Physical Exam Vitals and nursing note reviewed.  Constitutional:      Appearance: He is well-developed and well-nourished.  HENT:     Head: Normocephalic and atraumatic.  Eyes:     General:        Right eye: No discharge.        Left eye: No discharge.     Conjunctiva/sclera: Conjunctivae normal.  Neck:     Trachea: No tracheal deviation.  Cardiovascular:     Rate and Rhythm: Normal rate and regular rhythm.     Heart sounds: No murmur heard.   Pulmonary:     Effort: Pulmonary effort is normal.     Breath sounds: Normal breath sounds.  Abdominal:     General: There is no distension.     Palpations: Abdomen is soft.     Tenderness: There is no abdominal tenderness. There is no guarding.  Musculoskeletal:        General: No edema.     Cervical back: Normal range of motion and neck supple.  Skin:  General: Skin is warm.     Findings: No rash.  Neurological:     Mental Status: He is alert and oriented to person, place, and time.  Psychiatric:        Mood and Affect: Mood and affect normal.     ED Results / Procedures / Treatments   Labs (all labs ordered are listed, but only abnormal results are displayed) Labs Reviewed  BASIC METABOLIC PANEL - Abnormal; Notable for the following components:      Result Value   Glucose, Bld 114 (*)    All other components within normal limits  URINALYSIS, ROUTINE W REFLEX MICROSCOPIC - Abnormal; Notable for the following components:   APPearance HAZY (*)    Leukocytes,Ua TRACE (*)    Bacteria, UA RARE (*)    All other components within normal  limits  CBC  RAPID URINE DRUG SCREEN, HOSP PERFORMED  CBG MONITORING, ED    EKG EKG Interpretation  Date/Time:  Wednesday September 03 2020 05:01:44 EST Ventricular Rate:  74 PR Interval:  136 QRS Duration: 110 QT Interval:  378 QTC Calculation: 419 R Axis:   95 Text Interpretation: Normal sinus rhythm Rightward axis Incomplete right bundle branch block Borderline ECG When compared with ECG of 08/12/2020, No significant change was found Confirmed by Dione Booze ([id number]) on 09/03/2020 5:08:50 AM Also confirmed by Blane Ohara [phone])  on 09/03/2020 9:34:43 AM   Radiology No results found.  Procedures Procedures   Medications Ordered in ED Medications - No data to display  ED Course  I have reviewed the triage vital signs and the nursing notes.  Pertinent labs & imaging results that were available during my care of the patient were reviewed by me and considered in my medical decision making (see chart for details).    MDM Rules/Calculators/A&P                          Patient comes in with recurrent syncope nonexertional.  Patient does have lightheadedness and senses palpitations before the events.  Patient had blood work showing normal hemoglobin, normal electrolytes, normal kidney function.  No chest pain or shortness of breath.  No concern for ACS or pulmonary embolism at this time.  EKG reviewed no acute abnormalities.  With second visit for similar and recurrence recommended follow-up with cardiology discussed Holter monitor/echo and other work-up they deem necessary.  Sent message to cardiology discussed this with the patient.   Final Clinical Impression(s) / ED Diagnoses Final diagnoses:  Near syncope  Palpitations    Rx / DC Orders ED Discharge Orders    None       Blane Ohara, MD 09/03/20 1045

## 2020-09-03 NOTE — ED Triage Notes (Signed)
Pt comes from home via [facility] for syncopal episode, and palpations. Has had similar episodes over the past few months.

## 2020-09-03 NOTE — Discharge Instructions (Signed)
Stay well-hydrated.  Hold your blood pressure medication if you feel lightheaded. Follow-up with cardiology to discuss monitor and possible ultrasound of your heart.

## 2020-09-09 ENCOUNTER — Encounter: Payer: Self-pay | Admitting: Gastroenterology

## 2020-09-09 ENCOUNTER — Ambulatory Visit: Payer: No Typology Code available for payment source | Admitting: Internal Medicine

## 2020-09-09 NOTE — Progress Notes (Deleted)
Cardiology Office Note:    Date:  09/09/2020   ID:  Kenneth Horton, DOB 1986-01-16, MRN [id number]  PCP:  Clinic, [facility]  Cardiologist:  No primary care provider on file. *** Advanced Practice Provider:  No care team member to display Electrophysiologist:  None  {Press F2 to show EP APP, CHF, sleep or structural heart MD               :[id number]}  { Click here to update then REFRESH NOTE - MD (PCP) or APP (Team Member)  Change PCP Type for MD, Specialty for APP is either Cardiology or Clinical Cardiac Electrophysiology  :[id number]}   Referring MD: Clinic, [facility]   No chief complaint on file. ***  History of Present Illness:    Kenneth Horton is a 35 y.o. male with a hx of PTSD who presents for evaluation 09/09/20.  Patient notes that he is feeling ***.    Had 09/03/20 evaluation for syncope and palpitations that were recurrent over several months.  Work up largely benign- recommend [facility] and cardiology follow up.  Has had no chest pain, chest pressure, chest tightness, chest stinging ***.  Discomfort occurs with ***, worsens with ***, and improves with ***.  Patient exertion notable for *** with *** and feels no symptoms.  No shortness of breath, DOE ***.  No PND or orthopnea***.  No bendopnea***, weight gain***, leg swelling ***, or abdominal swelling***.  No syncope or near syncope ***. Notes *** no palpitations or funny heart beats.     Patient reports prior cardiac testing including *** echo, *** stress test, *** heart catheterizations, *** cardioversion, *** ablations.  No history of ***pre-eclampsia, early menarche, or prematurity.  No Fen-Phen or drug use***.  Ambulatory BP ***.   Past Medical History:  Diagnosis Date  . PTSD (post-traumatic stress disorder)   . PTSD (post-traumatic stress disorder)     No past surgical history on file.  Current Medications: No outpatient medications have been marked  as taking for the 09/09/20 encounter (Appointment) with Christell Constant, MD.     Allergies:   Patient has no known allergies.   Social History   Socioeconomic History  . Marital status: Legally Separated    Spouse name: Not on file  . Number of children: Not on file  . Years of education: Not on file  . Highest education level: Not on file  Occupational History  . Not on file  Tobacco Use  . Smoking status: Former Games developer  . Smokeless tobacco: Former Clinical biochemist  . Vaping Use: Never used  Substance and Sexual Activity  . Alcohol use: Never    Comment: socially  . Drug use: Never  . Sexual activity: Not on file  Other Topics Concern  . Not on file  Social History Narrative  . Not on file   Social Determinants of Health   Financial Resource Strain: Not on file  Food Insecurity: Not on file  Transportation Needs: Not on file  Physical Activity: Not on file  Stress: Not on file  Social Connections: Not on file     Family History: History of coronary artery disease notable for ***. History of heart failure notable for ***. No history of cardiomyopathies including hypertrophic cardiomyopathy, left ventricular non-compaction, or arrhythmogenic right ventricular cardiomyopathy.*** History of arrhythmia notable for ***. Denies family history of sudden cardiac death including drowning, car accidents, or unexplained deaths in the family.***  No history of bicuspid aortic valve or aortic aneurysm or dissection.  ROS:   Please see the history of present illness.    *** All other systems reviewed and are negative.  EKGs/Labs/Other Studies Reviewed:    The following studies were reviewed today:  EKG:   09/03/20:  SR 74 iRBBB  Recent Labs: 05/30/2020: ALT 80 08/13/2020: TSH 3.419 09/03/2020: BUN 11; Creatinine, Ser 1.01; Hemoglobin 16.1; Platelets 176; Potassium 3.8; Sodium 139  Recent Lipid Panel No results found for: CHOL, TRIG, HDL, CHOLHDL, VLDL, LDLCALC,  LDLDIRECT   Risk Assessment/Calculations:     N/A  Physical Exam:    VS:  There were no vitals taken for this visit.    Wt Readings from Last 3 Encounters:  05/30/20 260 lb (117.9 kg)  05/12/19 205 lb (93 kg)  05/02/18 220 lb (99.8 kg)     GEN: *** Well nourished, well developed in no acute distress HEENT: Normal NECK: No JVD; No carotid bruits LYMPHATICS: No lymphadenopathy CARDIAC: ***RRR, no murmurs, rubs, gallops RESPIRATORY:  Clear to auscultation without rales, wheezing or rhonchi  ABDOMEN: Soft, non-tender, non-distended MUSCULOSKELETAL:  No edema; No deformity  SKIN: Warm and dry NEUROLOGIC:  Alert and oriented x 3 PSYCHIATRIC:  Normal affect   ASSESSMENT:    No diagnosis found. PLAN:    In order of problems listed above:  1. ***   {Are you ordering a CV Procedure (e.g. stress test, cath, DCCV, TEE, etc)?   Press F2        :[id number]}    Medication Adjustments/Labs and Tests Ordered: Current medicines are reviewed at length with the patient today.  Concerns regarding medicines are outlined above.  No orders of the defined types were placed in this encounter.  No orders of the defined types were placed in this encounter.   There are no Patient Instructions on file for this visit.   Signed, Christell Constant, MD  09/09/2020 2:34 PM    [facility]

## 2020-09-12 ENCOUNTER — Other Ambulatory Visit (INDEPENDENT_AMBULATORY_CARE_PROVIDER_SITE_OTHER): Payer: No Typology Code available for payment source

## 2020-09-12 ENCOUNTER — Encounter: Payer: Self-pay | Admitting: Gastroenterology

## 2020-09-12 ENCOUNTER — Ambulatory Visit (INDEPENDENT_AMBULATORY_CARE_PROVIDER_SITE_OTHER): Payer: No Typology Code available for payment source | Admitting: Gastroenterology

## 2020-09-12 VITALS — BP 138/80 | HR 86 | Ht 69.0 in | Wt 240.0 lb

## 2020-09-12 DIAGNOSIS — R7989 Other specified abnormal findings of blood chemistry: Secondary | ICD-10-CM | POA: Diagnosis not present

## 2020-09-12 DIAGNOSIS — F4310 Post-traumatic stress disorder, unspecified: Secondary | ICD-10-CM | POA: Diagnosis not present

## 2020-09-12 LAB — CBC
HCT: 47.5 % (ref 39.0–52.0)
Hemoglobin: 16.3 g/dL (ref 13.0–17.0)
MCHC: 34.5 g/dL (ref 30.0–36.0)
MCV: 88 fl (ref 78.0–100.0)
Platelets: 153 10*3/uL (ref 150.0–400.0)
RBC: 5.39 Mil/uL (ref 4.22–5.81)
RDW: 13.3 % (ref 11.5–15.5)
WBC: 5.9 10*3/uL (ref 4.0–10.5)

## 2020-09-12 LAB — COMPREHENSIVE METABOLIC PANEL
ALT: 120 U/L — ABNORMAL HIGH (ref 0–53)
AST: 44 U/L — ABNORMAL HIGH (ref 0–37)
Albumin: 4.4 g/dL (ref 3.5–5.2)
Alkaline Phosphatase: 85 U/L (ref 39–117)
BUN: 10 mg/dL (ref 6–23)
CO2: 27 mEq/L (ref 19–32)
Calcium: 9.7 mg/dL (ref 8.4–10.5)
Chloride: 102 mEq/L (ref 96–112)
Creatinine, Ser: 0.9 mg/dL (ref 0.40–1.50)
GFR: 112.21 mL/min (ref >60.00)
Glucose, Bld: 99 mg/dL (ref 70–99)
Potassium: 3.9 mEq/L (ref 3.5–5.1)
Sodium: 136 mEq/L (ref 135–145)
Total Bilirubin: 0.5 mg/dL (ref 0.2–1.2)
Total Protein: 7.4 g/dL (ref 6.0–8.3)

## 2020-09-12 LAB — FERRITIN: Ferritin: 274.8 ng/mL (ref 22.0–322.0)

## 2020-09-12 LAB — PROTIME-INR
INR: 1.1 ratio — ABNORMAL HIGH (ref 0.8–1.0)
Prothrombin Time: 12.3 s (ref 9.6–13.1)

## 2020-09-12 NOTE — Progress Notes (Signed)
HPI: This is a very pleasant 35 year old man whom I am meeting for the first time. He was referred by the [facility] for elevated liver tests  He was in the [employer] for 8 years, he has been out for 4 years. 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 He has PTSD from some of his experiences while in the [employer]. He was tried on a variety of different medicines and has settled on Zoloft and he says it seems to help a bit but not completely. He sees a therapist about once yearly through the [place] system.  He had a son 2 years ago and shortly after that gained 60 pounds in a year. His son caught Covid and was quite ill he was very stressed out about that. He was having some lightheadedness palpitations near syncopal episode. Made some significant changes in his diet eating out less and has lost 15 pounds in the past 3 months or so. He is going to the gym and exercising more.  He has never had hepatitis that he is aware of and has never been jaundiced. Liver disease does not run in his family. He has no significant abdominal pains.   Old Data Reviewed: I reviewed a 25 page packet from the [facility] [place].  I cannot tell exactly who referred him here.  But he was sent for elevated liver tests.  Included in the packet are labs from January 2022.  ALT was 165, AST was 46.  The rest of his complete metabolic profile and CBC were completely normal.   Review of systems: Pertinent positive and negative review of systems were noted in the above HPI section. All other review negative.   Past Medical History:  Diagnosis Date  . PTSD (post-traumatic stress disorder)   . PTSD (post-traumatic stress disorder)     History reviewed. No pertinent surgical history.  Current Outpatient Medications  Medication Sig Dispense Refill  . atenolol (TENORMIN) 25 MG tablet Take 1 tablet by mouth daily.    . sertraline (ZOLOFT) 100 MG tablet TAKE  ONE-HALF TABLET BY MOUTH IN THE MORNING FOR MENTAL HEALTH ANXIETY/DEPRESSION/PTSD INC  DOSE     No current facility-administered medications for this visit.    Allergies as of 09/12/2020  . (No Known Allergies)    History reviewed. No pertinent family history.  Social History   Socioeconomic History  . Marital status: Legally Separated    Spouse name: Not on file  . Number of children: Not on file  . Years of education: Not on file  . Highest education level: Not on file  Occupational History  . Not on file  Tobacco Use  . Smoking status: Former Games developer  . Smokeless tobacco: Former Clinical biochemist  . Vaping Use: Never used  Substance and Sexual Activity  . Alcohol use: Never    Comment: socially  . Drug use: Never  . Sexual activity: Not on file  Other Topics Concern  . Not on file  Social History Narrative  . Not on file   Social Determinants of Health   Financial Resource Strain: Not on file  Food Insecurity: Not on file  Transportation Needs: Not on file  Physical Activity: Not on file  Stress: Not on file  Social Connections: Not on file  Intimate Partner Violence: Not on file     Physical Exam: BP 138/80 (BP Location: Left Arm, Patient Position: Sitting)  Pulse 86   Ht 5\' 9"  (1.753 m)   Wt 240 lb (108.9 kg)   SpO2 94%   BMI 35.44 kg/m  Constitutional: generally well-appearing Psychiatric: alert and oriented x3 Eyes: extraocular movements intact Mouth: oral pharynx moist, no lesions Neck: supple no lymphadenopathy Cardiovascular: heart regular rate and rhythm Lungs: clear to auscultation bilaterally Abdomen: soft, nontender, nondistended, no obvious ascites, no peritoneal signs, normal bowel sounds Extremities: no lower extremity edema bilaterally Skin: no lesions on visible extremities   Assessment and plan: 35 y.o. male with elevated liver tests  I think it is very likely that his elevated liver tests reflect fatty liver disease from his  60 pound weight gain over the past couple years. Fortunately he is already made some significant diet changes and exercise changes in his losing weight about 15 pounds in the past 3 months. I recommend that he continue that trend as best that he could. We will arrange blood tests, see those in the AVS below as well as ultrasound of his liver to exclude other potential causes of his elevated liver tests. I will communicate the results and further recommendations with him directly.  We also spoke about his PTSD struggles. He is unable to see therapist more than about once a year through the [facility] system for a variety of logistic reasons and systemic reasons. I offered he accepted referral to the [place] [facility] to help with his PTSD.   Please see the "Patient Instructions" section for addition details about the plan.   [place], MD [facility] 09/12/2020, 10:59 AM  Cc: Clinic, 09/14/2020  Total time on date of encounter was 45 minutes (this included time spent preparing to see the patient reviewing records; obtaining and/or reviewing separately obtained history; performing a medically appropriate exam and/or evaluation; counseling and educating the patient and family if present; ordering medications, tests or procedures if applicable; and documenting clinical information in the health record).

## 2020-09-12 NOTE — Patient Instructions (Signed)
If you are age 34 or younger, your body mass index should be between 19-25. Your Body mass index is 35.44 kg/m. If this is out of the aformentioned range listed, please consider follow up with your Primary Care Provider.   Your provider has requested that you go to the basement level for lab work before leaving today. Press "B" on the elevator. The lab is located at the first door on the left as you exit the elevator.  You have been scheduled for an abdominal ultrasound at [facility] (1st floor of hospital) on ________ at ________. Please arrive 15 minutes prior to your appointment for registration. Make certain not to have anything to eat or drink 6 hours prior to your appointment. Should you need to reschedule your appointment, please contact radiology at [phone]. This test typically takes about 30 minutes to perform.  We have referred you to [facility].  Someone from their office should contact your regarding an appointment.  Please let our office know if you have not heard anything in 1 to 2 weeks.   Due to recent changes in healthcare laws, you may see the results of your imaging and laboratory studies on MyChart before your provider has had a chance to review them.  We understand that in some cases there may be results that are confusing or concerning to you. Not all laboratory results come back in the same time frame and the provider may be waiting for multiple results in order to interpret others.  Please give [place] 48 hours in order for your provider to thoroughly review all the results before contacting the office for clarification of your results.   Thank you for entrusting me with your care and choosing [facility].  Dr Christella Hartigan

## 2020-09-17 LAB — CERULOPLASMIN: Ceruloplasmin: 26 mg/dL (ref 18–36)

## 2020-09-17 LAB — MITOCHONDRIAL ANTIBODIES: Mitochondrial M2 Ab, IgG: <=20 U

## 2020-09-17 LAB — HEPATITIS B SURFACE ANTIBODY,QUALITATIVE: Hep B S Ab: REACTIVE — AB

## 2020-09-17 LAB — HEPATITIS C ANTIBODY
Hepatitis C Ab: NONREACTIVE
SIGNAL TO CUT-OFF: 0.02 (ref <1.00)

## 2020-09-17 LAB — ANTI-NUCLEAR AB-TITER (ANA TITER): ANA Titer 1: 1:80 {titer} — ABNORMAL HIGH

## 2020-09-17 LAB — ALPHA-1-ANTITRYPSIN: A-1 Antitrypsin, Ser: 124 mg/dL (ref 83–199)

## 2020-09-17 LAB — IGA: Immunoglobulin A: 289 mg/dL (ref 47–310)

## 2020-09-17 LAB — HEPATITIS A ANTIBODY, TOTAL: Hepatitis A AB,Total: REACTIVE — AB

## 2020-09-17 LAB — TISSUE TRANSGLUTAMINASE, IGA: (tTG) Ab, IgA: <1 U/mL

## 2020-09-17 LAB — ANA: Anti Nuclear Antibody (ANA): POSITIVE — AB

## 2020-09-17 LAB — HEPATITIS B SURFACE ANTIGEN: Hepatitis B Surface Ag: NONREACTIVE

## 2020-09-17 LAB — ANTI-SMOOTH MUSCLE ANTIBODY, IGG: Actin (Smooth Muscle) Antibody (IGG): <20 U (ref <20)

## 2020-09-19 ENCOUNTER — Other Ambulatory Visit: Payer: Self-pay

## 2020-09-19 ENCOUNTER — Other Ambulatory Visit: Payer: No Typology Code available for payment source

## 2020-09-19 DIAGNOSIS — R7989 Other specified abnormal findings of blood chemistry: Secondary | ICD-10-CM

## 2020-09-22 ENCOUNTER — Telehealth: Payer: Self-pay

## 2020-09-22 LAB — PROTEIN ELECTROPHORESIS, SERUM
Albumin ELP: 4.8 g/dL (ref 3.8–4.8)
Alpha 1: 0.3 g/dL (ref 0.2–0.3)
Alpha 2: 0.6 g/dL (ref 0.5–0.9)
Beta 2: 0.4 g/dL (ref 0.2–0.5)
Beta Globulin: 0.5 g/dL (ref 0.4–0.6)
Gamma Globulin: 1.1 g/dL (ref 0.8–1.7)
Total Protein: 7.6 g/dL (ref 6.1–8.1)

## 2020-09-22 NOTE — Telephone Encounter (Signed)
-----   Message from Lamona Curl, [place] sent at 09/12/2020  4:02 PM EST ----- Regarding: abd [place] Did you get results from [place] on patient for RUQ abd US done on 09-17-2020?

## 2020-09-22 NOTE — Telephone Encounter (Signed)
Left message for patient to return call to verify he had RUQ abd ultrasound on 09-18-2031 at [facility].  Will continue to reach patient.

## 2020-09-23 NOTE — Telephone Encounter (Signed)
Faxed note to [place] requesting copy of ultrasound to be faxed to our office.

## 2020-09-24 NOTE — Telephone Encounter (Signed)
Left message for patient to return call for results. Will continue efforts. 

## 2020-09-25 NOTE — Telephone Encounter (Signed)
Received ultrasound results from [facility].  Left on Dr Christella Hartigan' desk for his review. Patient aware.

## 2020-09-25 NOTE — Telephone Encounter (Signed)
Left message for patient to return call for results. Also, received fax from [place] stating ultrasound was completed outside the [place].  I will need to verify where ultrasound was completed for Dr Christella Hartigan to review.   Will continue efforts.

## 2020-09-26 NOTE — Telephone Encounter (Signed)
I reviewed outside records.  Ultrasound September 17, 2020 at [facility].  Indication elevated liver tests, hypertension.  Findings "hepatic steatosis.  Negative acute."   Please let him know that I reviewed his ultrasound report.  This is all pointing towards just fatty liver disease.  He should continue to try to lose weight.  He needs repeat LFTs in 3 months and office visit with me shortly after that.  Thank you

## 2020-09-26 NOTE — Telephone Encounter (Signed)
Left message on voicemail that Dr Christella Hartigan has reviewed ultrasound report and advised that the test points to fatty liver.  Advised that patient should continue to loose weight and would need repeat LFT's in 3 months and an appointment shortly after.  Will send myself a staff message to remind patient of lab work and follow up appointment.  Advised patient to call with questions or concerns.

## 2020-12-02 ENCOUNTER — Telehealth: Payer: Self-pay

## 2020-12-02 NOTE — Telephone Encounter (Signed)
Left message for patient to return call to schedule follow up appointment with Dr Christella Hartigan in June 2022.  Patient will need lab work (LFT's) prior to appointment.  Will continue efforts.

## 2020-12-02 NOTE — Telephone Encounter (Signed)
-----   Message from Lamona Curl, [place] sent at 09/26/2020 11:13 AM EST ----- Regarding: 3 months remind patient of lab work (LFT's) and follow up appointment with DJ dx fatty liver.  June 2022

## 2020-12-04 NOTE — Telephone Encounter (Signed)
Left message for patient to return call to schedule follow up appointment with Dr Jacobs in June 2022.  Patient will need lab work (LFT's) prior to appointment.  Will continue efforts.  

## 2020-12-05 NOTE — Telephone Encounter (Signed)
Left message for patient to return call to schedule follow up appointment with Dr Christella Hartigan in June 2022. Patient will need lab work (LFT's) prior to appointment.  Letter also mailed to patient reminding him to call to schedule an appointment and have lab work completed.

## 2021-02-11 ENCOUNTER — Other Ambulatory Visit: Payer: No Typology Code available for payment source

## 2021-11-30 ENCOUNTER — Ambulatory Visit (HOSPITAL_COMMUNITY)
Admission: EM | Admit: 2021-11-30 | Discharge: 2021-11-30 | Disposition: A | Discharge status: Home / Self Care | Payer: No Typology Code available for payment source | Attending: Internal Medicine | Admitting: Internal Medicine

## 2021-11-30 ENCOUNTER — Encounter (HOSPITAL_COMMUNITY): Payer: Self-pay | Admitting: Emergency Medicine

## 2021-11-30 DIAGNOSIS — J029 Acute pharyngitis, unspecified: Secondary | ICD-10-CM | POA: Diagnosis present

## 2021-11-30 LAB — POCT RAPID STREP A, ED / UC: Streptococcus, Group A Screen (Direct): NEGATIVE

## 2021-11-30 MED ORDER — MOUTHWASH COMPOUNDING BASE PO LIQD: 15.0000 mL | Freq: Four times a day (QID) | ORAL | 0 refills | Status: AC | PRN

## 2021-11-30 NOTE — ED Triage Notes (Signed)
Pt reports throat pain since Friday. Son was sick last week with a virus. Reports had to leave work today due to pain with talking and swallowing.  ?

## 2021-11-30 NOTE — ED Provider Notes (Signed)
?[facility] ? ? ? ?CSN: [id number] ?Arrival date & time: 11/30/21  1238 ? ? ?  ? ?History   ?Chief Complaint ?Chief Complaint  ?Patient presents with  ? Sore Throat  ? ? ?HPI ?Kenneth Horton is a 36 y.o. male comes to the urgent care with 3-day history of severe sore throat.  Patient is child was ill with viral symptoms about a week ago.  Patient then started experiencing symptoms since Friday.  Patient's sore throat has worsened over the past 3 days.  Sore throat is sharp, constant, aggravated by swallowing with no known relieving factors.  He is try Chloraseptic throat spray, warm salt water gargle and other over-the-counter remedies with no improvement in his symptoms.  He comes to urgent care to be evaluated.  No rash on the skin.  No shortness of breath.  No change in voice or hoarseness of voice.  No noisy breathing..  ? ?HPI ? ?Past Medical History:  ?Diagnosis Date  ? PTSD (post-traumatic stress disorder)   ? PTSD (post-traumatic stress disorder)   ? ? ?There are no problems to display for this patient. ? ? ?History reviewed. No pertinent surgical history. ? ? ? ? ?Home Medications   ? ?Prior to Admission medications   ?Medication Sig Start Date End Date Taking? Authorizing Provider  ?Mouthwash Compounding Base LIQD Swish and spit 15 mLs 4 (four) times daily as needed for up to 5 days. Compounding: Maalox 80 mL, lidocaine viscous 2% 80 mL, Benadryl 12.5 mg/5 ml 80 mL 11/30/21 12/05/21 Yes [place], Britta Mccreedy, MD  ? ? ?Family History ?No family history on file. ? ?Social History ?Social History  ? ?Tobacco Use  ? Smoking status: Former  ? Smokeless tobacco: Former  ?Vaping Use  ? Vaping Use: Never used  ?Substance Use Topics  ? Alcohol use: Never  ?  Comment: socially  ? Drug use: Never  ? ? ? ?Allergies   ?Patient has no known allergies. ? ? ?Review of Systems ?Review of Systems  ?HENT:  Positive for sore throat.   ?Eyes: Negative.   ?Respiratory: Negative.    ?Gastrointestinal: Negative.    ?Endocrine: Negative.   ?Genitourinary: Negative.   ?Musculoskeletal: Negative.   ? ? ?Physical Exam ?Triage Vital Signs ?ED Triage Vitals  ?Enc Vitals Group  ?   BP 11/30/21 1430 130/75  ?   Pulse Rate 11/30/21 1430 85  ?   Resp 11/30/21 1430 17  ?   Temp 11/30/21 1430 98.5 ?F (36.9 ?C)  ?   Temp Source 11/30/21 1430 Oral  ?   SpO2 11/30/21 1430 98 %  ?   Weight --   ?   Height --   ?   Head Circumference --   ?   Peak Flow --   ?   Pain Score 11/30/21 1429 8  ?   Pain Loc --   ?   Pain Edu? --   ?   Excl. in GC? --   ? ?No data found. ? ?Updated Vital Signs ?BP 130/75 (BP Location: Right Arm)   Pulse 85   Temp 98.5 ?F (36.9 ?C) (Oral)   Resp 17   SpO2 98%  ? ?Visual Acuity ?Right Eye Distance:   ?Left Eye Distance:   ?Bilateral Distance:   ? ?Right Eye Near:   ?Left Eye Near:    ?Bilateral Near:    ? ?Physical Exam ?Vitals and nursing note reviewed.  ?Constitutional:   ?   General:  He is not in acute distress. ?   Appearance: He is well-developed. He is not ill-appearing.  ?HENT:  ?   Right Ear: Tympanic membrane normal.  ?   Left Ear: Tympanic membrane normal.  ?   Mouth/Throat:  ?   Mouth: Mucous membranes are moist. Mucous membranes are pale.  ?   Pharynx: Posterior oropharyngeal erythema present.  ?   Tonsils: No tonsillar exudate or tonsillar abscesses.  ?Cardiovascular:  ?   Rate and Rhythm: Normal rate and regular rhythm.  ?Pulmonary:  ?   Effort: Pulmonary effort is normal.  ?   Breath sounds: Normal breath sounds.  ?Neurological:  ?   Mental Status: He is alert.  ? ? ? ?UC Treatments / Results  ?Labs ?(all labs ordered are listed, but only abnormal results are displayed) ?Labs Reviewed  ?POCT RAPID STREP A, ED / UC  ? ? ?EKG ? ? ?Radiology ?No results found. ? ?Procedures ?Procedures (including critical care time) ? ?Medications Ordered in UC ?Medications - No data to display ? ?Initial Impression / Assessment and Plan / UC Course  ?I have reviewed the triage vital signs and the nursing  notes. ? ?Pertinent labs & imaging results that were available during my care of the patient were reviewed by me and considered in my medical decision making (see chart for details). ? ?  ? ?1.  Acute viral pharyngitis: ?Continue home remedies i.e. warm salt water gargle, lemon tea with honey and Chloraseptic throat spray ?Mouthwash to gargle/rinse and spit ?Symptoms will improve in the coming days ?Tylenol/Motrin as needed for pain ?Return to urgent care if symptoms worsen. ?Final Clinical Impressions(s) / UC Diagnoses  ? ?Final diagnoses:  ?Viral pharyngitis  ? ? ? ?Discharge Instructions   ? ?  ?Please take medications as prescribed ?Strep test is negative ?No indication for COVID testing given the duration of his symptoms ?Maintain adequate hydration ?Return to urgent care if symptoms worsen. ? ? ?ED Prescriptions   ? ? Medication Sig Dispense Auth. Provider  ? Mouthwash Compounding Base LIQD Swish and spit 15 mLs 4 (four) times daily as needed for up to 5 days. Compounding: Maalox 80 mL, lidocaine viscous 2% 80 mL, Benadryl 12.5 mg/5 ml 80 mL 240 mL Muzamil Harker, Britta Mccreedy, MD  ? ?  ? ?PDMP not reviewed this encounter. ?  ?Merrilee Jansky, MD ?11/30/21 1541 ? ?

## 2021-11-30 NOTE — Discharge Instructions (Signed)
Please take medications as prescribed ?Strep test is negative ?No indication for COVID testing given the duration of his symptoms ?Maintain adequate hydration ?Return to urgent care if symptoms worsen. ?

## 2021-12-03 LAB — CULTURE, GROUP A STREP (THRC)

## 2022-02-23 ENCOUNTER — Ambulatory Visit: Payer: No Typology Code available for payment source | Admitting: Registered"

## 2022-03-17 IMAGING — CT CT MAXILLOFACIAL W/O CM
3 series · 16 of 47 positions shown, 19 images · non-contrast
Comparison: No pertinent prior exams are available for comparison.

CLINICAL DATA: Head trauma, moderate/severe. Syncopal episode, fall
in face down, abrasion to nose.

EXAM:
CT HEAD WITHOUT CONTRAST
CT MAXILLOFACIAL WITHOUT CONTRAST
TECHNIQUE: Multidetector CT imaging of the head and maxillofacial structures
were performed using the standard protocol without intravenous
contrast. Multiplanar CT image reconstructions of the maxillofacial
structures were also generated.

[Series 4: facialbone 2.0 st · axial · 0.39mm/px · z∈[-214,-46]mm · 10 of 98 slices shown, 13 images]
[im 7/98  brain]
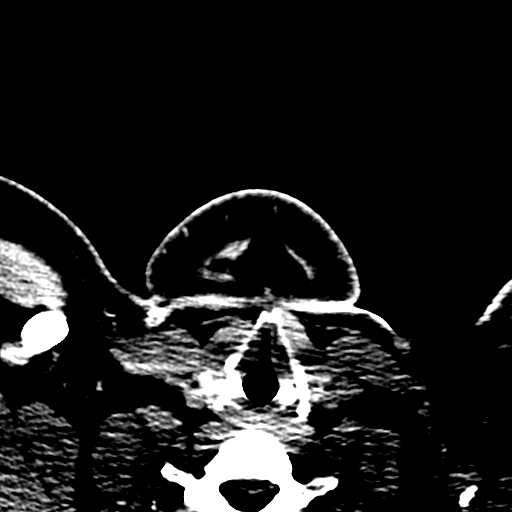
[im 7/98  bone]
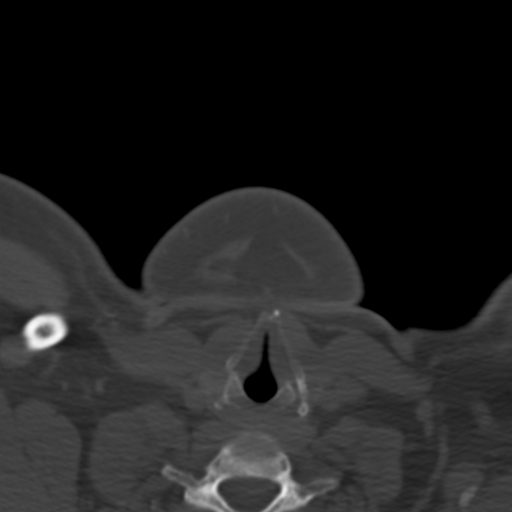
[im 17/98  bone]
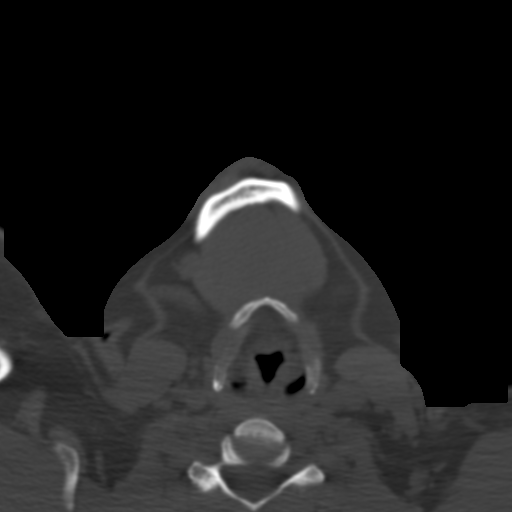
[im 27/98  bone]
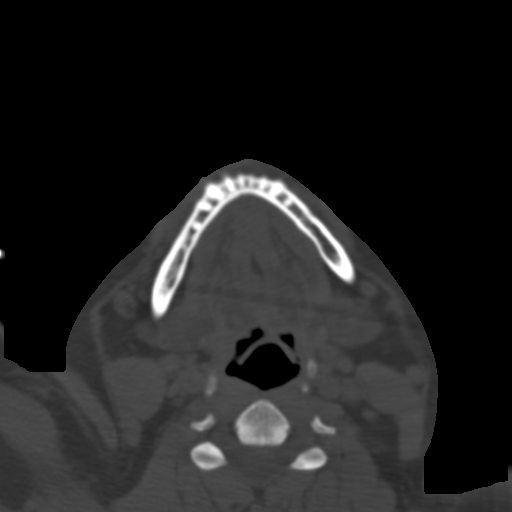
[im 34/98  bone]
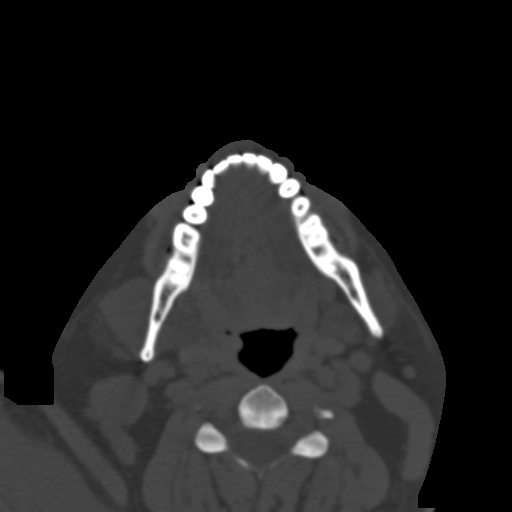
[im 44/98  brain]
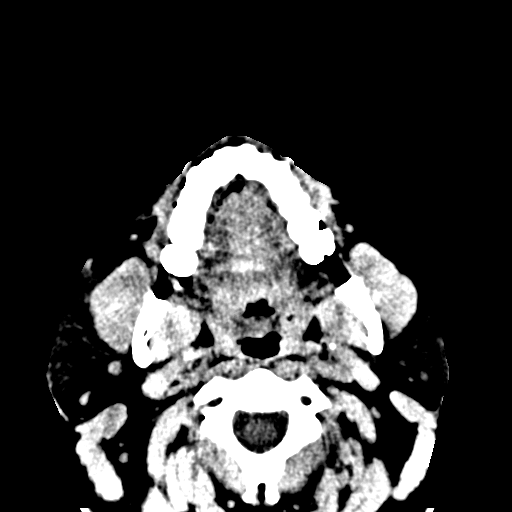
[im 44/98  bone]
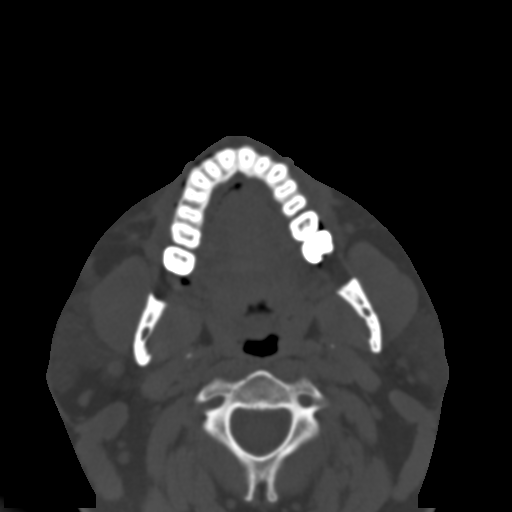
[im 54/98  bone]
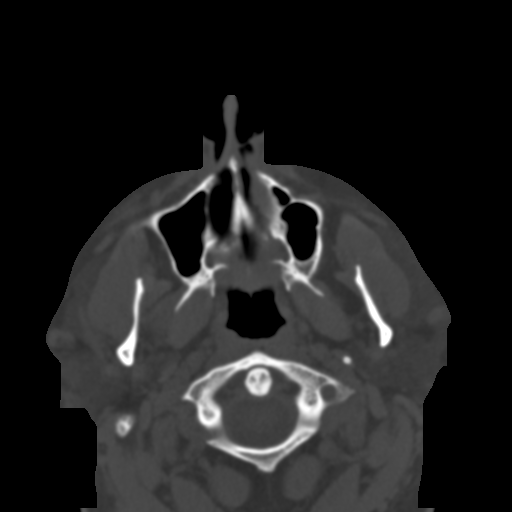
[im 64/98  bone]
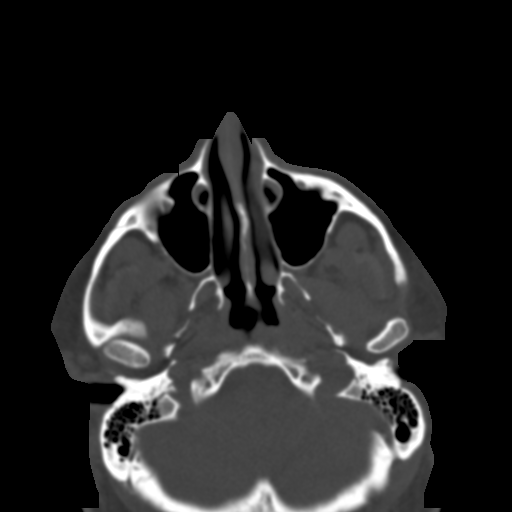
[im 74/98  bone]
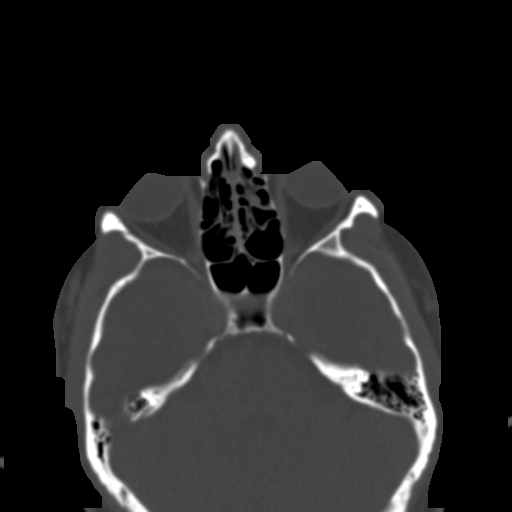
[im 81/98  brain]
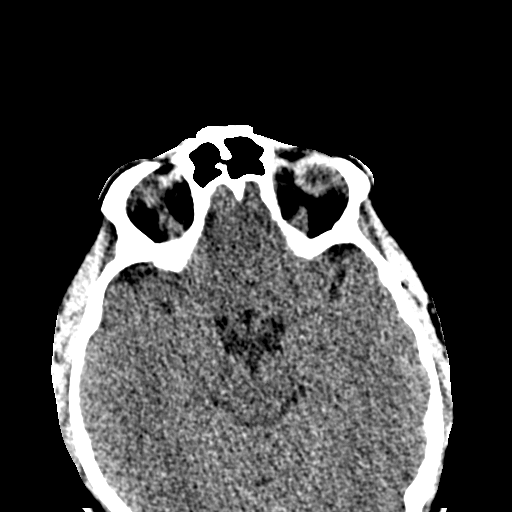
[im 81/98  bone]
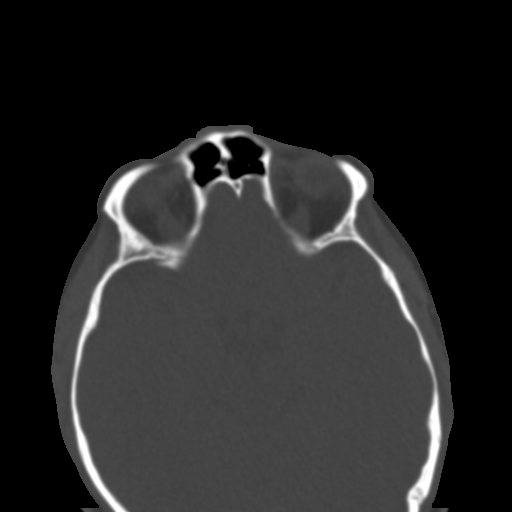
[im 91/98  bone]
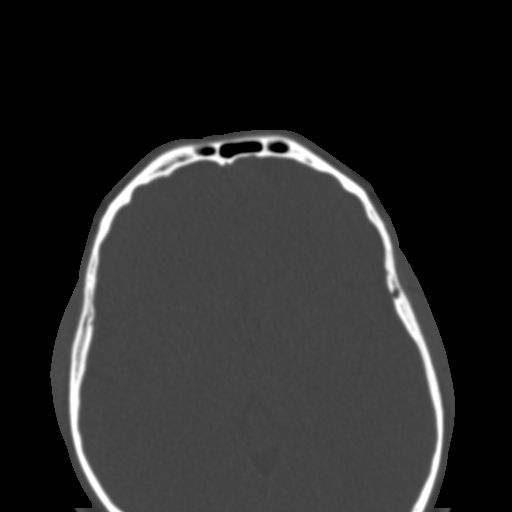

[Series 7: facialbone 2.0 cor st · coronal · 0.40mm/px · 3 of 68 slices shown]
[im 23/68  bone]
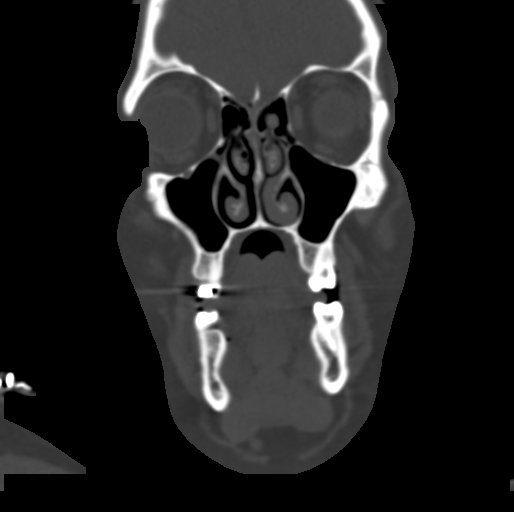
[im 30/68  bone]
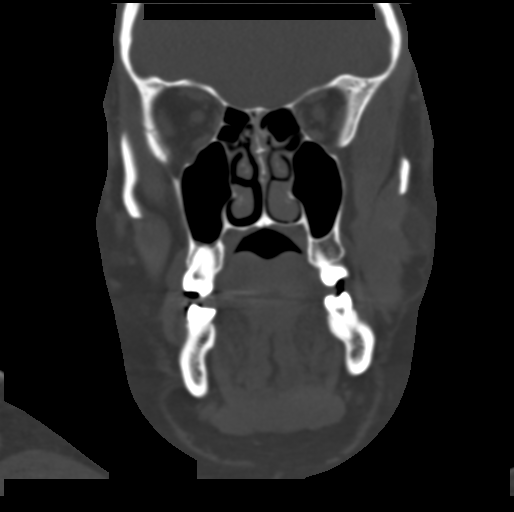
[im 38/68  bone]
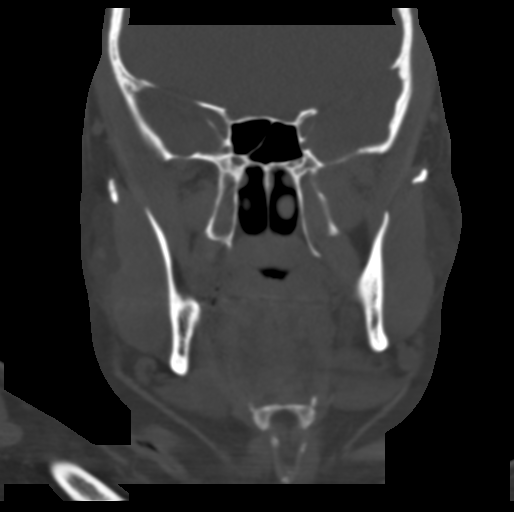

[Series 8: facialbone 2.0 sag st · sagittal · 0.37mm/px · 3 of 87 slices shown]
[im 29/87  bone]
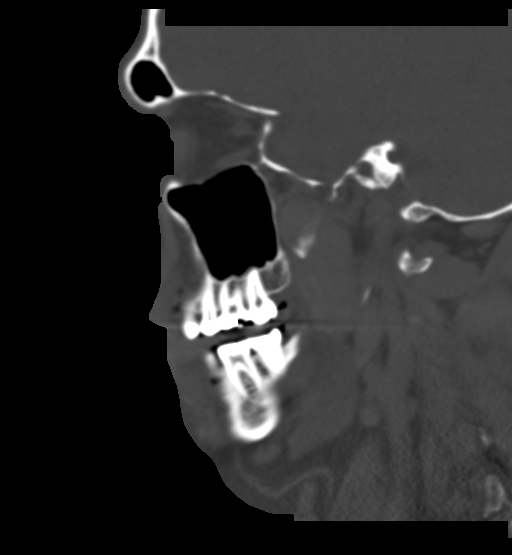
[im 44/87  bone]
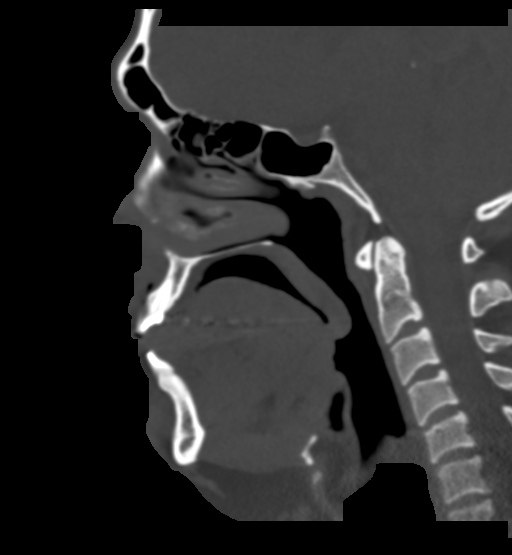
[im 58/87  bone]
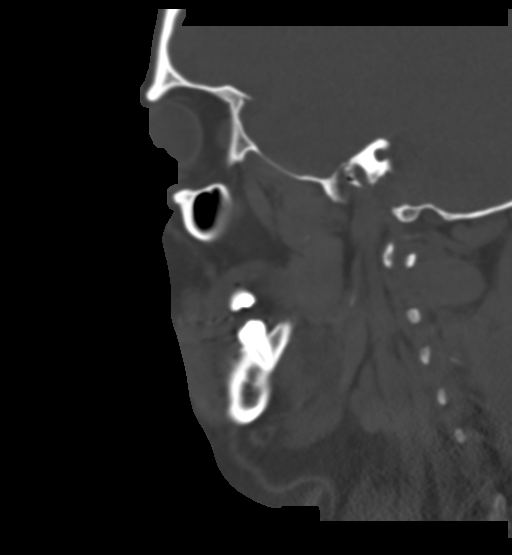

[16 of 47 positions shown; findings below may reference images not displayed]

FINDINGS: CT HEAD FINDINGS

Brain:

Cerebral volume is normal.

There is no acute intracranial hemorrhage.

No demarcated cortical infarct.

No extra-axial fluid collection.

No evidence of intracranial mass.

No midline shift.

Vascular: No hyperdense vessel.

Skull: Normal. Negative for fracture or focal lesion.

CT MAXILLOFACIAL FINDINGS

Osseous: Minimally displaced fracture of the left nasal bone (series
3, image 67) (series 5, image 5). No other maxillofacial fracture is
identified.

Orbits: No acute finding. The globes are normal in size and contour.
The extraocular muscles and optic nerve sheath complexes are
symmetric and unremarkable.

Sinuses: Mild ethmoid and maxillary sinus mucosal thickening.

Soft tissues: Soft tissue swelling along the nose.
IMPRESSION: CT head:

No evidence of acute intracranial abnormality.

CT maxillofacial:

1. Minimally displaced fracture of the left nasal bone.
2. Soft tissue swelling along the nose.
3. Mild ethmoid and maxillary sinus mucosal thickening.

## 2022-04-19 ENCOUNTER — Encounter: Payer: No Typology Code available for payment source | Attending: "Endocrinology | Admitting: Skilled Nursing Facility1

## 2022-04-19 ENCOUNTER — Encounter: Payer: Self-pay | Admitting: Skilled Nursing Facility1

## 2022-04-19 DIAGNOSIS — E669 Obesity, unspecified: Secondary | ICD-10-CM | POA: Diagnosis present

## 2022-04-19 NOTE — Progress Notes (Signed)
Medical Nutrition Therapy   Primary concerns today: to lower blood pressure   Referral diagnosis: e66.9 Preferred learning style: auditory, visual Learning readiness: change in progress   NUTRITION ASSESSMENT   Clinical Medical Hx: OSA, anxiety: PTSD, HTN Medications: N/A Labs: none within EMR Notable Signs/Symptoms: difficulty losing weight, fatigued   Lifestyle & Dietary Hx  Pt states he struggles to sleep with his C-PAP so he has not been wearing it.  Pt states he stays tired all the time.  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  Pt states in the last 3 years he has found difficulty in losing weight stating his lifestyle did become more sedentary and eat did start eating out a lot more.  Pt states he was about 205 pounds about 3-4 years ago. Pt states his weight has been maintained at about 253 pounds since about 2-3 years.  Pt states he is a little competitive so sees his friends closing their rings which makes him want to be more active.  Pt states his work provides lunch but he brings his own.  Pt states his girlfriend makes most of the meals which adds soy sauce or fish sauce.   Pt states he does not eat after 8:30pm.  Pt states he has been feeling bloated with GERD lately.   Body Composition Scale 04/19/2022  Current Body Weight 253.6  Total Body Fat % 31.5  Visceral Fat 21  Fat-Free Mass % 68.4   Total Body Water % 49.4  Muscle-Mass lbs 47.7  BMI 37.2  Body Fat Displacement          Torso  lbs 49.5         Left Leg  lbs 9.9         Right Leg  lbs 9.9         Left Arm  lbs 4.9         Right Arm   lbs 4.9     Estimated daily fluid intake: 125 oz Supplements: N/A Sleep: struggling due to PTSD and the sleep apnea Stress / self-care: pretty low  Current average weekly physical activity: moves around at work when his watch tells him to stand he will walk  24-Hr Dietary Recall: W: work, O:off; eats out 2 times a week First Meal:  greek yogurt + fruit or starbucks breakfast Snack: chips or cliff bar Second Meal: pasta or steak + butter and rice and non starchy veggies or salad Snack: sandwich  Third Meal: salad or sub or eaten out Snack:  Beverages: once a week shaken esspresso (11g Sugars), coffee + half and half creamer, hot tea, water, 8 ounces of wine  Estimated Energy Needs Calories: 1800   NUTRITION INTERVENTION  Nutrition education (E-1) on the following topics:  Creating balanced meals HTN and sodium Eating out and health Macronutrient distribution   Handouts Provided Include  Meal ideas sheet  Learning Style & Readiness for Change Teaching method utilized: Visual & Auditory  Demonstrated degree of understanding via: Teach Back  Barriers to learning/adherence to lifestyle change: none identified    MONITORING & EVALUATION Dietary intake, weekly physical activity  Next Steps  Patient is to call or email with any questions of concerns.

## 2022-04-26 ENCOUNTER — Ambulatory Visit: Payer: No Typology Code available for payment source | Admitting: Dietician

## 2024-02-16 ENCOUNTER — Other Ambulatory Visit: Payer: Self-pay | Admitting: Medical Genetics

## 2024-05-04 ENCOUNTER — Other Ambulatory Visit: Payer: Self-pay | Admitting: Medical Genetics

## 2024-05-04 DIAGNOSIS — Z006 Encounter for examination for normal comparison and control in clinical research program: Secondary | ICD-10-CM
